# Patient Record
Sex: Male | Born: 1960 | Race: White | Hispanic: No | Marital: Single | Smoking: Never smoker
Health system: Southern US, Community
[De-identification: ages and names within clinical notes are randomized; demographics above are authoritative.]

## PROBLEM LIST (undated history)

## (undated) DIAGNOSIS — J45909 Unspecified asthma, uncomplicated: Secondary | ICD-10-CM

## (undated) DIAGNOSIS — C439 Malignant melanoma of skin, unspecified: Secondary | ICD-10-CM

## (undated) HISTORY — DX: Malignant melanoma of skin, unspecified: C43.9

## (undated) HISTORY — DX: Unspecified asthma, uncomplicated: J45.909

---

## 2005-04-21 ENCOUNTER — Emergency Department (HOSPITAL_COMMUNITY): Admission: EM | Admit: 2005-04-21 | Discharge: 2005-04-21 | Payer: Self-pay | Admitting: Emergency Medicine

## 2009-09-21 ENCOUNTER — Encounter: Admission: RE | Admit: 2009-09-21 | Discharge: 2009-09-21 | Payer: Self-pay | Admitting: Unknown Physician Specialty

## 2010-04-08 ENCOUNTER — Encounter: Payer: Self-pay | Admitting: Family Medicine
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
## 2014-05-08 ENCOUNTER — Institutional Professional Consult (permissible substitution): Payer: Self-pay | Admitting: Internal Medicine

## 2014-05-12 ENCOUNTER — Ambulatory Visit (INDEPENDENT_AMBULATORY_CARE_PROVIDER_SITE_OTHER): Payer: Managed Care, Other (non HMO) | Admitting: Internal Medicine

## 2014-05-12 ENCOUNTER — Ambulatory Visit (INDEPENDENT_AMBULATORY_CARE_PROVIDER_SITE_OTHER)
Admission: RE | Admit: 2014-05-12 | Discharge: 2014-05-12 | Disposition: A | Discharge status: Home / Self Care | Payer: Managed Care, Other (non HMO) | Source: Ambulatory Visit | Attending: Internal Medicine | Admitting: Internal Medicine

## 2014-05-12 ENCOUNTER — Encounter (INDEPENDENT_AMBULATORY_CARE_PROVIDER_SITE_OTHER): Payer: Self-pay

## 2014-05-12 ENCOUNTER — Encounter: Payer: Self-pay | Admitting: Internal Medicine

## 2014-05-12 VITALS — BP 120/80 | HR 88 | Temp 98.9°F | Ht 71.0 in | Wt 216.6 lb

## 2014-05-12 DIAGNOSIS — J45909 Unspecified asthma, uncomplicated: Secondary | ICD-10-CM | POA: Insufficient documentation

## 2014-05-12 DIAGNOSIS — J4530 Mild persistent asthma, uncomplicated: Secondary | ICD-10-CM

## 2014-05-12 MED ORDER — FAMOTIDINE 20 MG PO TABS: ORAL_TABLET | ORAL | Status: AC

## 2014-05-12 NOTE — Progress Notes (Signed)
   Subjective:    Patient ID: Brendan Mack, male    DOB: Apr 19, 1961  MRN: [id number]  HPI  2 yowm never smoker intermittent asthma as child but able to do sports then and as adult walking up 10 miles a day using saba prn and only using advair x 2-3 weeks out of the year with pfts showing worsening so referred to pulmonary clinic 05/12/2014 by Brendan Mack    05/12/2014 1st [facility] office visit/ Brendan Mack   Chief Complaint  Patient presents with  . Pulmonary Consult    Referred by Brendan Cunas, NP. Pt states that he was dxed with Asthma at birth. He recently had spirometry that was abnormal.  He denies any respiratory co's today.   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  hfa quite poor so not clear he's really getting much in  Also has poorly controlled overt hb  No obvious  patterns in day to day or daytime variabilty or assoc chronic cough or cp or chest tightness, subjective wheeze overt sinus or hb symptoms. No unusual exp hx or h/o childhood pna/ asthma or knowledge of premature birth.  Sleeping ok without nocturnal  or early am exacerbation  of respiratory  c/o's or need for noct saba. Also denies any obvious fluctuation of symptoms with weather or environmental changes or other aggravating or alleviating factors except as outlined above   Current Medications, Allergies, Complete Past Medical History, Past Surgical History, Family History, and Social History were reviewed in [employer] record.              Review of Systems  Constitutional: Negative for fever, chills, activity change, appetite change and unexpected weight change.  HENT: Negative for congestion, dental problem, postnasal drip, rhinorrhea, sneezing, sore throat, trouble swallowing and voice change.   Eyes: Negative for visual disturbance.  Respiratory: Negative for cough, choking and shortness of breath.   Cardiovascular: Negative for chest pain and leg swelling.    Gastrointestinal: Negative for nausea, vomiting and abdominal pain.  Genitourinary: Negative for difficulty urinating.       Heartburn Indigestion  Musculoskeletal: Negative for arthralgias.  Skin: Positive for rash.  Psychiatric/Behavioral: Negative for behavioral problems and confusion.       Objective:   Physical Exam  Wt Readings from Last 3 Encounters:  05/12/14 216 lb 9.6 oz (98.249 kg)     Anxious amb wm nad   HEENT: nl dentition, turbinates, and orophanx. Nl external ear canals without cough reflex   NECK :  without JVD/Nodes/TM/ nl carotid upstrokes bilaterally   LUNGS: no acc muscle use, clear to A and P bilaterally without cough on insp or exp maneuvers   CV:  RRR  no s3 or murmur or increase in P2, no edema   ABD:  soft and nontender with nl excursion in the supine position. No bruits or organomegaly, bowel sounds nl  MS:  warm without deformities, calf tenderness, cyanosis or clubbing  SKIN: warm and dry without lesions    NEURO:  alert, approp, no deficits     CXR  05/12/2014 :   The heart size and mediastinal contours are within normal limits.  Both lungs are clear. No pneumothorax or pleural effusion is noted.  The visualized skeletal structures are unremarkable      Assessment & Plan:

## 2014-05-12 NOTE — Patient Instructions (Addendum)
Stop advair for now and just use albuterol if you need it for your breathing but goal is less than twice weekly  Work on inhaler technique:  relax and gently blow all the way out then take a nice smooth deep breath back in, triggering the inhaler at same time you start breathing in.  Hold for up to 5 seconds if you can.  Rinse and gargle with water when done     Nexium Take 30-60 min before first meal of the day and pepcid 20 mg until you return  GERD (REFLUX)  is an extremely common cause of respiratory symptoms, many times with no significant heartburn at all.    It can be treated with medication, but also with lifestyle changes including avoidance of late meals, excessive alcohol, smoking cessation, and avoid fatty foods, chocolate, peppermint, colas, red wine, and acidic juices such as orange juice.  NO MINT OR MENTHOL PRODUCTS SO NO COUGH DROPS  USE SUGARLESS CANDY INSTEAD (jolley ranchers or Stover's)  NO OIL BASED VITAMINS - use powdered substitutes.  Please remember to go to the  x-ray department downstairs for your tests - we will call you with the results when they are available.      Please schedule a follow up office visit in 4 weeks, sooner if needed with pfts

## 2014-05-13 NOTE — Assessment & Plan Note (Signed)
DDX of  difficult airways management all start with A and  include Adherence, Ace Inhibitors, Acid Reflux, Active Sinus Disease, Alpha 1 Antitripsin deficiency, Anxiety masquerading as Airways dz,  ABPA,  allergy(esp in young), Aspiration (esp in elderly), Adverse effects of DPI,  Active smokers, plus two Bs  = Bronchiectasis and Beta blocker use..and one C= CHF  Adherence is always the initial "prime suspect" and is a multilayered concern that requires a "trust but verify" approach in every patient - starting with knowing how to use medications, especially inhalers, correctly, keeping up with refills and understanding the fundamental difference between maintenance and prns vs those medications only taken for a very short course and then stopped and not refilled.  - advair not approp "prn" and I'm sure that's not the way he was asked to use it.  - The proper method of use, as well as anticipated side effects, of a metered-dose inhaler are discussed and demonstrated to the patient. Improved effectiveness after extensive coaching during this visit to a level of approximately  75% so might be a good candidate for qvar if pfts nl or symbicort/ dulera if not, but only after addressing GERD  ? Acid (or non-acid) GERD > always difficult to exclude as up to 75% of pts in some series report no assoc GI/ Heartburn symptoms> rec max (24h)  acid suppression and diet restrictions/ reviewed and instructions given in writing.   ? Allergy > nothing to support this by hx but may pursue w/u  if can't control his symptoms better longterm on min maint regimen.    Each maintenance medication was reviewed in detail including most importantly the difference between maintenance and as needed and under what circumstances the prns are to be used.  Please see instructions for details which were reviewed in writing and the patient given a copy.

## 2014-05-16 NOTE — Progress Notes (Signed)
Quick Note:  Spoke with pt and notified of results per Dr. Wert. Pt verbalized understanding and denied any questions.  ______ 

## 2014-06-08 ENCOUNTER — Other Ambulatory Visit: Payer: Self-pay | Admitting: Internal Medicine

## 2014-06-08 DIAGNOSIS — J4531 Mild persistent asthma with (acute) exacerbation: Secondary | ICD-10-CM

## 2014-06-09 ENCOUNTER — Ambulatory Visit: Payer: Managed Care, Other (non HMO) | Admitting: Internal Medicine

## 2015-06-07 ENCOUNTER — Encounter: Payer: Self-pay | Admitting: Family Medicine

## 2018-06-24 ENCOUNTER — Encounter: Payer: Self-pay | Admitting: Unknown Physician Specialty

## 2018-08-25 ENCOUNTER — Other Ambulatory Visit: Payer: Self-pay | Admitting: Unknown Physician Specialty

## 2018-08-25 DIAGNOSIS — R0602 Shortness of breath: Secondary | ICD-10-CM

## 2018-08-25 DIAGNOSIS — R05 Cough: Secondary | ICD-10-CM

## 2018-08-25 DIAGNOSIS — R059 Cough, unspecified: Secondary | ICD-10-CM

## 2018-08-26 ENCOUNTER — Ambulatory Visit (INDEPENDENT_AMBULATORY_CARE_PROVIDER_SITE_OTHER): Payer: Managed Care, Other (non HMO)

## 2018-08-26 DIAGNOSIS — R059 Cough, unspecified: Secondary | ICD-10-CM

## 2018-08-26 DIAGNOSIS — R0602 Shortness of breath: Secondary | ICD-10-CM

## 2018-08-26 DIAGNOSIS — K449 Diaphragmatic hernia without obstruction or gangrene: Secondary | ICD-10-CM | POA: Diagnosis not present

## 2018-08-26 DIAGNOSIS — R05 Cough: Secondary | ICD-10-CM

## 2020-04-24 IMAGING — DX DG CHEST 2V
2 series · 2 of 2 positions shown · non-contrast
Comparison: 05/12/2014

CLINICAL DATA: Shortness of breath cough

EXAM:
CHEST - 2 VIEW

[chest pa]
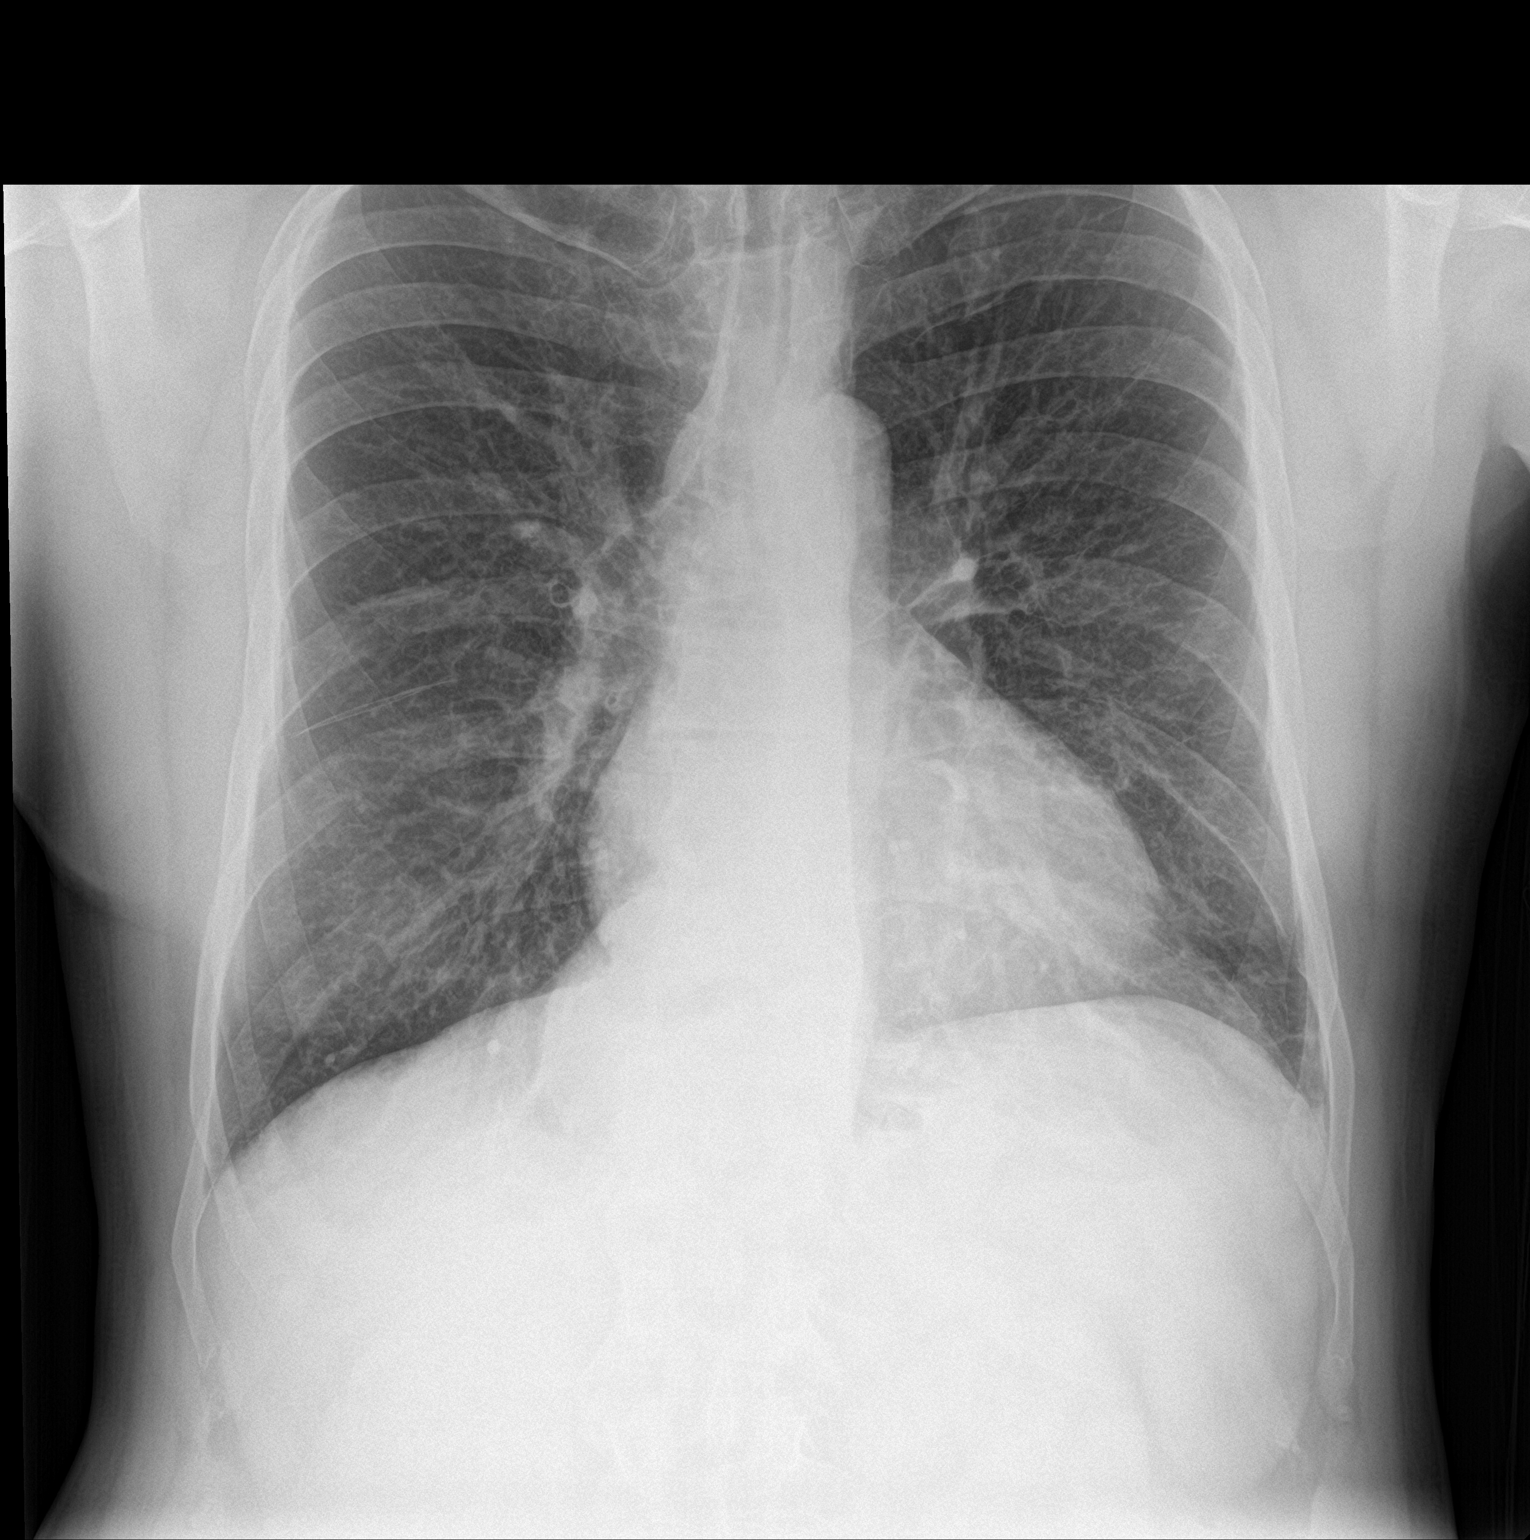

[chest lat]
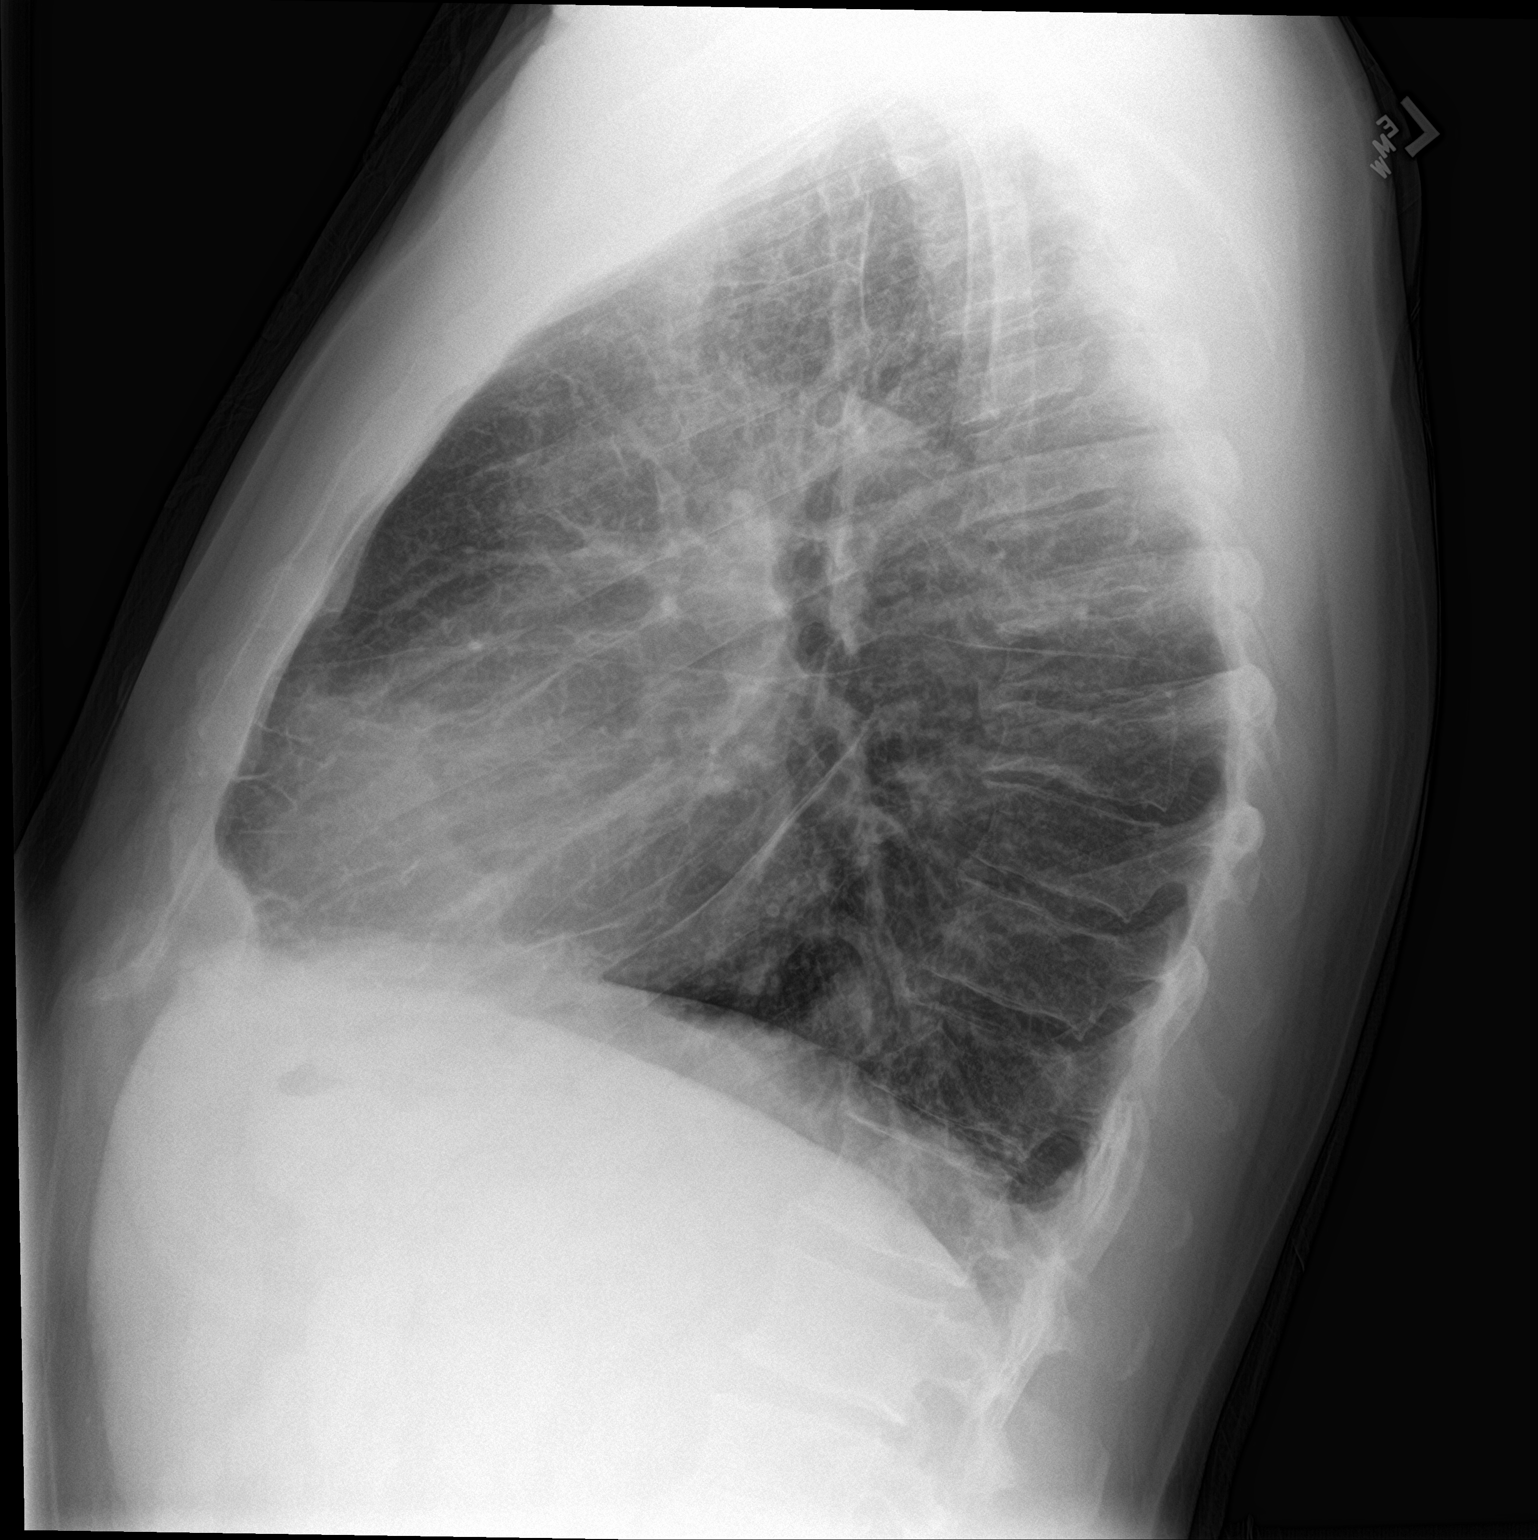

[2 of 2 positions shown; findings below may reference images not displayed]

FINDINGS: Moderate hiatal hernia. Large lung volumes with interstitial
coarsening above prior baseline. No collapse or consolidation. No
effusion or pneumothorax.
IMPRESSION: 1. Bronchitic markings and large lung volumes.
2. Moderate hiatal hernia.
